# Patient Record
Sex: Female | Born: 1960 | Race: White | Hispanic: No | Marital: Married | Smoking: Former smoker
Health system: Southern US, Community
[De-identification: ages and names within clinical notes are randomized; demographics above are authoritative.]

## PROBLEM LIST (undated history)

## (undated) DIAGNOSIS — E785 Hyperlipidemia, unspecified: Secondary | ICD-10-CM

## (undated) DIAGNOSIS — A048 Other specified bacterial intestinal infections: Secondary | ICD-10-CM

## (undated) DIAGNOSIS — I509 Heart failure, unspecified: Secondary | ICD-10-CM

## (undated) DIAGNOSIS — M1990 Unspecified osteoarthritis, unspecified site: Secondary | ICD-10-CM

## (undated) DIAGNOSIS — J449 Chronic obstructive pulmonary disease, unspecified: Secondary | ICD-10-CM

## (undated) DIAGNOSIS — I10 Essential (primary) hypertension: Secondary | ICD-10-CM

## (undated) DIAGNOSIS — I2510 Atherosclerotic heart disease of native coronary artery without angina pectoris: Secondary | ICD-10-CM

## (undated) DIAGNOSIS — I219 Acute myocardial infarction, unspecified: Secondary | ICD-10-CM

## (undated) DIAGNOSIS — Z5189 Encounter for other specified aftercare: Secondary | ICD-10-CM

## (undated) HISTORY — PX: CHOLECYSTECTOMY: SHX55

## (undated) HISTORY — PX: CORONARY ARTERY BYPASS GRAFT: SHX141

---

## 2010-09-02 ENCOUNTER — Emergency Department (HOSPITAL_BASED_OUTPATIENT_CLINIC_OR_DEPARTMENT_OTHER)
Admission: EM | Admit: 2010-09-02 | Discharge: 2010-09-02 | Disposition: A | Discharge status: Home / Self Care | Payer: Medicare Other | Attending: Emergency Medicine | Admitting: Emergency Medicine

## 2010-09-02 ENCOUNTER — Encounter: Payer: Self-pay | Admitting: *Deleted

## 2010-09-02 DIAGNOSIS — R109 Unspecified abdominal pain: Secondary | ICD-10-CM

## 2010-09-02 DIAGNOSIS — I252 Old myocardial infarction: Secondary | ICD-10-CM | POA: Insufficient documentation

## 2010-09-02 DIAGNOSIS — Z79899 Other long term (current) drug therapy: Secondary | ICD-10-CM | POA: Insufficient documentation

## 2010-09-02 DIAGNOSIS — Z951 Presence of aortocoronary bypass graft: Secondary | ICD-10-CM | POA: Insufficient documentation

## 2010-09-02 DIAGNOSIS — J4489 Other specified chronic obstructive pulmonary disease: Secondary | ICD-10-CM | POA: Insufficient documentation

## 2010-09-02 DIAGNOSIS — F17200 Nicotine dependence, unspecified, uncomplicated: Secondary | ICD-10-CM | POA: Insufficient documentation

## 2010-09-02 DIAGNOSIS — J449 Chronic obstructive pulmonary disease, unspecified: Secondary | ICD-10-CM | POA: Insufficient documentation

## 2010-09-02 DIAGNOSIS — R197 Diarrhea, unspecified: Secondary | ICD-10-CM

## 2010-09-02 DIAGNOSIS — E785 Hyperlipidemia, unspecified: Secondary | ICD-10-CM | POA: Insufficient documentation

## 2010-09-02 HISTORY — DX: Hyperlipidemia, unspecified: E78.5

## 2010-09-02 HISTORY — DX: Chronic obstructive pulmonary disease, unspecified: J44.9

## 2010-09-02 HISTORY — DX: Acute myocardial infarction, unspecified: I21.9

## 2010-09-02 HISTORY — DX: Other specified bacterial intestinal infections: A04.8

## 2010-09-02 LAB — URINALYSIS, ROUTINE W REFLEX MICROSCOPIC
Ketones, ur: NEGATIVE mg/dL
Leukocytes, UA: NEGATIVE
Nitrite: NEGATIVE
Protein, ur: NEGATIVE mg/dL
Urobilinogen, UA: 0.2 mg/dL (ref 0.0–1.0)

## 2010-09-02 LAB — CBC
MCHC: 32.3 g/dL (ref 30.0–36.0)
Platelets: 210 10*3/uL (ref 150–400)
RDW: 14.3 % (ref 11.5–15.5)

## 2010-09-02 LAB — COMPREHENSIVE METABOLIC PANEL
ALT: 15 U/L (ref 0–35)
Albumin: 3.6 g/dL (ref 3.5–5.2)
Alkaline Phosphatase: 111 U/L (ref 39–117)
BUN: 11 mg/dL (ref 6–23)
Potassium: 3.9 mEq/L (ref 3.5–5.1)
Sodium: 138 mEq/L (ref 135–145)
Total Protein: 7.7 g/dL (ref 6.0–8.3)

## 2010-09-02 MED ORDER — PROMETHAZINE HCL 25 MG PO TABS
12.5000 mg | ORAL_TABLET | Freq: Once | ORAL | Status: AC
  Administered 2010-09-02: 12.5 mg via ORAL
  Filled 2010-09-02: qty 1

## 2010-09-02 MED ORDER — HYDROCODONE-ACETAMINOPHEN 5-325 MG PO TABS
2.0000 | ORAL_TABLET | Freq: Once | ORAL | Status: AC
  Administered 2010-09-02: 2 via ORAL
  Filled 2010-09-02: qty 2

## 2010-09-02 MED ORDER — HYDROCODONE-ACETAMINOPHEN 7.5-325 MG PO TABS: 1.0000 | ORAL_TABLET | ORAL | Status: AC | PRN

## 2010-09-02 NOTE — ED Provider Notes (Signed)
History     CSN: [id number] Arrival date & time: 09/02/2010  8:48 PM  Chief Complaint  Patient presents with  . Abdominal Pain   Patient is a 50 y.o. female presenting with abdominal pain. The history is provided by the patient.  Abdominal Pain The primary symptoms of the illness include abdominal pain, nausea and diarrhea. The primary symptoms of the illness do not include shortness of breath or dysuria. Episode onset: 3 weeks. The problem has not changed since onset. The diarrhea began more than 1 week ago. The diarrhea is semi-solid, malodorous and mucous. The diarrhea occurs 5 to 10 times per day.  The patient states that she believes she is currently not pregnant. The patient has not had a change in bowel habit. Additional symptoms associated with the illness include heartburn. Symptoms associated with the illness do not include chills, anorexia, diaphoresis, constipation, hematuria, frequency or back pain. Associated medical issues comments: question colitis..    Past Medical History  Diagnosis Date  . Acute MI   . Hyperlipidemia   . COPD (chronic obstructive pulmonary disease)   . Helicobacter pylori (H. pylori)     Past Surgical History  Procedure Date  . Coronary artery bypass graft   . Cholecystectomy     No family history on file.  History  Substance Use Topics  . Smoking status: Current Everyday Smoker  . Smokeless tobacco: Not on file  . Alcohol Use: Yes    OB History    Grav Para Term Preterm Abortions TAB SAB Ect Mult Living                  Review of Systems  Constitutional: Negative for chills, diaphoresis and activity change.       All ROS Neg except as noted in HPI  HENT: Negative for nosebleeds and neck pain.   Eyes: Negative for photophobia and discharge.  Respiratory: Negative for cough, shortness of breath and wheezing.   Cardiovascular: Negative for chest pain and palpitations.  Gastrointestinal: Positive for heartburn, nausea, abdominal pain  and diarrhea. Negative for constipation, blood in stool and anorexia.  Genitourinary: Negative for dysuria, frequency and hematuria.  Musculoskeletal: Negative for back pain and arthralgias.  Skin: Negative.   Neurological: Negative for dizziness, seizures and speech difficulty.  Psychiatric/Behavioral: Negative for hallucinations and confusion.    Physical Exam  BP 111/63  Pulse 88  Temp(Src) 98 F (36.7 C) (Oral)  Resp 20  SpO2 97%  Physical Exam  Nursing note and vitals reviewed. Constitutional: She is oriented to person, place, and time. She appears well-developed and well-nourished.  Non-toxic appearance.  HENT:  Head: Normocephalic.  Right Ear: Tympanic membrane and external ear normal.  Left Ear: Tympanic membrane and external ear normal.  Eyes: EOM and lids are normal. Pupils are equal, round, and reactive to light.  Neck: Normal range of motion. Neck supple. Carotid bruit is not present.  Cardiovascular: Normal rate, regular rhythm, normal heart sounds, intact distal pulses and normal pulses.   Pulmonary/Chest: Breath sounds normal. No respiratory distress.  Abdominal: Soft. Bowel sounds are normal. There is no tenderness. There is no guarding.       Increase bowel sounds present. No distention. No pain to palpation, but right and left lower quad pain present.  Genitourinary:       Chaperone present during rectal exam. Stool negative for occult blood.  Musculoskeletal: Normal range of motion.  Lymphadenopathy:       Head (right side): No submandibular  adenopathy present.       Head (left side): No submandibular adenopathy present.    She has no cervical adenopathy.  Neurological: She is alert and oriented to person, place, and time. She has normal strength. No cranial nerve deficit or sensory deficit.  Skin: Skin is warm and dry.  Psychiatric: She has a normal mood and affect. Her speech is normal.    ED Course  Procedures  MDM      Kathie Dike,  [place] 09/02/10 2221

## 2010-09-02 NOTE — ED Notes (Signed)
Pt c/o intermittent lower abd x3 weeks. Pt had appt to see her GI doctor today but the office called her and cancelled her appt.

## 2010-09-11 NOTE — ED Provider Notes (Signed)
Medical screening examination/treatment/procedure(s) were performed by non-physician practitioner and as supervising physician I was immediately available for consultation/collaboration.  Cyndra Numbers, MD 09/11/10 [phone]

## 2017-01-05 ENCOUNTER — Emergency Department (HOSPITAL_COMMUNITY): Payer: Medicare Other

## 2017-01-05 ENCOUNTER — Emergency Department (HOSPITAL_COMMUNITY)
Admission: EM | Admit: 2017-01-05 | Discharge: 2017-01-06 | Disposition: A | Discharge status: Home / Self Care | Payer: Medicare Other | Attending: Emergency Medicine | Admitting: Emergency Medicine

## 2017-01-05 ENCOUNTER — Encounter (HOSPITAL_COMMUNITY): Payer: Self-pay | Admitting: Emergency Medicine

## 2017-01-05 DIAGNOSIS — Z79899 Other long term (current) drug therapy: Secondary | ICD-10-CM | POA: Insufficient documentation

## 2017-01-05 DIAGNOSIS — Z87891 Personal history of nicotine dependence: Secondary | ICD-10-CM | POA: Insufficient documentation

## 2017-01-05 DIAGNOSIS — I2510 Atherosclerotic heart disease of native coronary artery without angina pectoris: Secondary | ICD-10-CM | POA: Diagnosis not present

## 2017-01-05 DIAGNOSIS — J441 Chronic obstructive pulmonary disease with (acute) exacerbation: Secondary | ICD-10-CM | POA: Insufficient documentation

## 2017-01-05 DIAGNOSIS — I509 Heart failure, unspecified: Secondary | ICD-10-CM | POA: Insufficient documentation

## 2017-01-05 DIAGNOSIS — Z7982 Long term (current) use of aspirin: Secondary | ICD-10-CM | POA: Insufficient documentation

## 2017-01-05 DIAGNOSIS — Z951 Presence of aortocoronary bypass graft: Secondary | ICD-10-CM | POA: Diagnosis not present

## 2017-01-05 DIAGNOSIS — R079 Chest pain, unspecified: Secondary | ICD-10-CM | POA: Diagnosis present

## 2017-01-05 DIAGNOSIS — I110 Hypertensive heart disease with heart failure: Secondary | ICD-10-CM | POA: Insufficient documentation

## 2017-01-05 HISTORY — DX: Unspecified osteoarthritis, unspecified site: M19.90

## 2017-01-05 HISTORY — DX: Encounter for other specified aftercare: Z51.89

## 2017-01-05 HISTORY — DX: Essential (primary) hypertension: I10

## 2017-01-05 HISTORY — DX: Atherosclerotic heart disease of native coronary artery without angina pectoris: I25.10

## 2017-01-05 HISTORY — DX: Heart failure, unspecified: I50.9

## 2017-01-05 LAB — I-STAT TROPONIN, ED: TROPONIN I, POC: 0.02 ng/mL (ref 0.00–0.08)

## 2017-01-05 LAB — CBC
HCT: 31.9 % — ABNORMAL LOW (ref 36.0–46.0)
Hemoglobin: 9.7 g/dL — ABNORMAL LOW (ref 12.0–15.0)
MCH: 31.2 pg (ref 26.0–34.0)
MCHC: 30.4 g/dL (ref 30.0–36.0)
MCV: 102.6 fL — AB (ref 78.0–100.0)
PLATELETS: 173 10*3/uL (ref 150–400)
RBC: 3.11 MIL/uL — ABNORMAL LOW (ref 3.87–5.11)
RDW: 14.8 % (ref 11.5–15.5)
WBC: 7.1 10*3/uL (ref 4.0–10.5)

## 2017-01-05 LAB — BASIC METABOLIC PANEL
Anion gap: 6 (ref 5–15)
BUN: 15 mg/dL (ref 6–20)
CHLORIDE: 91 mmol/L — AB (ref 101–111)
CO2: 43 mmol/L — AB (ref 22–32)
Calcium: 9 mg/dL (ref 8.9–10.3)
Creatinine, Ser: 1.1 mg/dL — ABNORMAL HIGH (ref 0.44–1.00)
GFR calc non Af Amer: 54 mL/min — ABNORMAL LOW (ref >60)
Glucose, Bld: 130 mg/dL — ABNORMAL HIGH (ref 65–99)
POTASSIUM: 3.8 mmol/L (ref 3.5–5.1)
SODIUM: 140 mmol/L (ref 135–145)

## 2017-01-05 LAB — I-STAT BETA HCG BLOOD, ED (MC, WL, AP ONLY): I-stat hCG, quantitative: <5 m[IU]/mL (ref <5)

## 2017-01-05 MED ORDER — IPRATROPIUM BROMIDE 0.02 % IN SOLN
0.5000 mg | Freq: Once | RESPIRATORY_TRACT | Status: AC
  Administered 2017-01-05: 0.5 mg via RESPIRATORY_TRACT
  Filled 2017-01-05: qty 2.5

## 2017-01-05 MED ORDER — ALBUTEROL SULFATE (2.5 MG/3ML) 0.083% IN NEBU
5.0000 mg | INHALATION_SOLUTION | Freq: Once | RESPIRATORY_TRACT | Status: AC
  Administered 2017-01-05: 5 mg via RESPIRATORY_TRACT
  Filled 2017-01-05: qty 6

## 2017-01-05 MED ORDER — AMOXICILLIN-POT CLAVULANATE 875-125 MG PO TABS: 1.0000 | ORAL_TABLET | Freq: Two times a day (BID) | ORAL | 0 refills | Status: AC

## 2017-01-05 MED ORDER — MORPHINE SULFATE (PF) 4 MG/ML IV SOLN
4.0000 mg | Freq: Once | INTRAVENOUS | Status: AC
  Administered 2017-01-05: 4 mg via INTRAVENOUS
  Filled 2017-01-05: qty 1

## 2017-01-05 MED ORDER — PREDNISONE 20 MG PO TABS: ORAL_TABLET | ORAL | 0 refills | Status: AC

## 2017-01-05 NOTE — Discharge Instructions (Signed)
Take medications as prescribed. You can return to [facility]. Follow up with your doctor for recheck in 2-3 days.

## 2017-01-05 NOTE — ED Provider Notes (Signed)
  Face-to-face evaluation   History: She presents for evaluation of chest pain and shortness of breath.  She is currently in a skilled nursing facility,  moved there yesterday [facility].  Patient is frustrated that she is not getting what she believes is appropriate care in the skilled section.  She has had respiratory difficulty for about a month, been hospitalized, went home, then rehospitalized and placed in a long-term treatment center for rehabilitation.  Physical exam: Obese alert cooperative.  No respiratory distress.  Lungs have decreased air movement, bilaterally, with scattered wheezing.  MDM-evaluation consistent with mild COPD exacerbation, without significant evidence for pneumonia.  Doubt ACS, PE or impending vascular collapse.  Medical screening examination/treatment/procedure(s) were conducted as a shared visit with non-physician practitioner(s) and myself.  I personally evaluated the patient during the encounter    Mancel BaleWentz, Tawnee Clegg, MD 01/12/17 [phone]

## 2017-01-05 NOTE — ED Notes (Signed)
ED Provider at bedside. 

## 2017-01-05 NOTE — ED Triage Notes (Signed)
[facility]. Pt at [facility] for recent resp failure; resp e/u. Pt in and out of Af[facility] RVR PTA; received 4 nitro and 324 ASA PTA. A&Ox4; family at bedside.

## 2017-01-05 NOTE — ED Provider Notes (Signed)
[facility] Provider Note   CSN: [id number] Arrival date & time: 01/05/17  2104     History   Chief Complaint Chief Complaint  Patient presents with  . Chest Pain    HPI Cynthia Zhang is a 56 y.o. female.  Patient with a history of CAD/MI, COPD, CHF, HTN, HLD, paroxysmal atrial fibrillation, presents with chest pain that started this afternoon while at rest. No nausea or vomiting. She describes the pain as a pressure that radiates into her right arm. She was given NTG x 5, 3 at [facility] where she is a resident, and 2 by EMS without relief or change of the pain. Currently it has decreased from a "6/10" to a "3/10". She reports that symptoms were not the same as previous MI. She reports SOB but that it is the same as SOB she has with COPD. Per record review she had acardiac catheterization 09/21/16 for NSTEMI without found for chest pain.   The history is provided by the patient and medical records. No language interpreter was used.  Chest Pain   Associated symptoms include shortness of breath. Pertinent negatives include no diaphoresis, no fever, no nausea and no vomiting.    Past Medical History:  Diagnosis Date  . Acute MI (HCC)   . Arthritis   . Blood transfusion without reported diagnosis   . CHF (congestive heart failure) (HCC)   . COPD (chronic obstructive pulmonary disease) (HCC)   . Coronary artery disease   . Helicobacter pylori (H. pylori)   . Hyperlipidemia   . Hypertension     There are no active problems to display for this patient.   Past Surgical History:  Procedure Laterality Date  . CHOLECYSTECTOMY    . CORONARY ARTERY BYPASS GRAFT      OB History    No data available       Home Medications    Prior to Admission medications   Medication Sig Start Date End Date Taking? Authorizing Provider  albuterol (PROVENTIL,VENTOLIN) 90 MCG/ACT inhaler Inhale 2 puffs into the lungs every 6 (six) hours as needed.  Shortness of breath and wheezing      [provider]  ALPRAZolam (XANAX) 1 MG tablet Take 1 mg by mouth 3 (three) times daily as needed. anxiety    [provider]  aspirin 325 MG EC tablet Take 325 mg by mouth daily.      [provider]  atorvastatin (LIPITOR) 40 MG tablet Take 40 mg by mouth daily.      [provider]  budesonide-formoterol (SYMBICORT) 160-4.5 MCG/ACT inhaler Inhale 2 puffs into the lungs 2 (two) times daily.      [provider]  Cyanocobalamin (VITAMIN B-12 IJ) Inject 1,000 mg as directed every 30 (thirty) days.      [provider]  DULoxetine (CYMBALTA) 60 MG capsule Take 60 mg by mouth 2 (two) times daily.      [provider]  ergocalciferol (VITAMIN D2) 50000 UNITS capsule Take 50,000 Units by mouth once a week. Take on Sunday     [provider]  ferrous sulfate 325 (65 FE) MG tablet Take 325 mg by mouth 2 (two) times daily.     [provider]  lisinopril (PRINIVIL,ZESTRIL) 20 MG tablet Take 20 mg by mouth daily.      [provider]  metoprolol (LOPRESSOR) 50 MG tablet Take 50 mg by mouth 2 (two) times daily.      [provider]  metoprolol (TOPROL-XL) 50 MG 24 hr tablet Take 50 mg by mouth daily.      [provider]  Multiple Vitamin (MULTIVITAMIN) tablet Take 1 tablet by mouth daily.      [provider]  pantoprazole (PROTONIX) 40 MG tablet Take 40 mg by mouth 2 (two) times daily.      [provider]  theophylline (THEO-24) 300 MG 24 hr capsule Take 300 mg by mouth 2 (two) times daily.      [provider]  tiotropium (SPIRIVA) 18 MCG inhalation capsule Place 18 mcg into inhaler and inhale daily.      [provider]    Family History No family history on file.  Social History Social History   Tobacco Use  . Smoking status: Former Smoker    Last attempt to quit: 05/06/2016    Years since quitting: 0.6    Substance Use Topics  . Alcohol use: No    Frequency: Never  . Drug use: No     Allergies   Patient has no known allergies.   Review of Systems Review of Systems  Constitutional: Negative for chills, diaphoresis and fever.  Respiratory: Positive for shortness of breath.   Cardiovascular: Positive for chest pain.  Gastrointestinal: Negative.  Negative for nausea and vomiting.  Musculoskeletal: Negative.   Skin: Negative.   Neurological: Negative.      Physical Exam Updated Vital Signs BP 111/69   Pulse 69   Resp 19   Ht 5\' 7"  (1.702 m)   Wt 86.2 kg (190 lb)   SpO2 98%   BMI 29.76 kg/m   Physical Exam  Constitutional: She is oriented to person, place, and time. She appears well-developed and well-nourished.  HENT:  Head: Normocephalic.  Neck: Normal range of motion. Neck supple.  Cardiovascular: An irregularly irregular rhythm present. Tachycardia present.  No murmur heard. Pulmonary/Chest: Effort normal. She has decreased breath sounds (Throughout). She has no wheezes. She has no rhonchi. She has no rales.  Abdominal: Soft. Bowel sounds are normal. There is no tenderness. There is no rebound and no guarding.  Musculoskeletal: Normal range of motion.       Right lower leg: Normal. She exhibits no edema.       Left lower leg: Normal. She exhibits no edema.  Neurological: She is alert and oriented to person, place, and time.  Skin: Skin is warm and dry. No rash noted.  Psychiatric: She has a normal mood and affect.     ED Treatments / Results  Labs (all labs ordered are listed, but only abnormal results are displayed) Labs Reviewed  BASIC METABOLIC PANEL  CBC  I-STAT TROPONIN, ED  I-STAT BETA HCG BLOOD, ED ([facility], [facility], [facility] ONLY)   Results for orders placed or performed during the hospital encounter of 01/05/17  CBC  Result Value Ref Range   WBC 7.1 4.0 - 10.5 K/uL   RBC 3.11 (L) 3.87 - 5.11 MIL/uL   Hemoglobin 9.7 (L) 12.0 - 15.0 g/dL   HCT 57.831.9 (L) 46.936.0 -  46.0 %   MCV 102.6 (H) 78.0 - 100.0 fL   MCH 31.2 26.0 - 34.0 pg   MCHC 30.4 30.0 - 36.0 g/dL   RDW 62.914.8 52.811.5 - 41.315.5 %   Platelets 173 150 - 400 K/uL  I-stat troponin, ED  Result Value Ref Range   Troponin i, poc 0.02 0.00 - 0.08 ng/mL   Comment 3  I-Stat beta hCG blood, ED  Result Value Ref Range   I-stat hCG, quantitative <5.0 <5 mIU/mL   Comment 3            EKG  EKG Interpretation None       Radiology No results found.  Procedures Procedures (including critical care time)  Medications Ordered in ED Medications  albuterol (PROVENTIL) (2.5 MG/3ML) 0.083% nebulizer solution 5 mg (5 mg Nebulization Given 01/05/17 2159)  ipratropium (ATROVENT) nebulizer solution 0.5 mg (0.5 mg Nebulization Given 01/05/17 2159)  morphine 4 MG/ML injection 4 mg (4 mg Intravenous Given 01/05/17 2200)     Initial Impression / Assessment and Plan / ED Course  I have reviewed the triage vital signs and the nursing notes.  Pertinent labs & imaging results that were available during my care of the patient were reviewed by me and considered in my medical decision making (see chart for details).     Patient presents with SOB and chest pain with history of COPD and CAD. Labs, EKG and imaging unremarkable to for concerning findings to suggest acute cardiac event or infection.   She is given multiple breathing treatments and seems to be improved. VSS, no hypoxia  She is currently residing at [facility] and was recently moved from one unit to another, which she is not happy with. She prefers to be hospitalized tonight but feel she is stable for discharge with presentation c/w COPD exacerbation. She is seen and evaluated by Dr. Effie Shy who has had a full discussion with patient and husband. She will be discharged back to [facility] to [facility] her rehab.  Final Clinical Impressions(s) / ED Diagnoses   Final diagnoses:  None   1. Mild COPD exacberation  ED Discharge Orders    None        Danne Harbor 01/11/17 2010    Mancel Bale, MD 01/12/17 [phone]

## 2017-01-05 NOTE — ED Notes (Signed)
Portable xray at bedside.

## 2018-01-23 DEATH — deceased

## 2018-04-15 IMAGING — DX DG CHEST 1V PORT
1 series · 1 of 1 positions shown · non-contrast
Comparison: Multiple prior exams most recent radiograph 12/25/2016.
Most recent CT 12/06/2016

CLINICAL DATA: Chest pain and shortness of breath tonight.

EXAM:
PORTABLE CHEST 1 VIEW

[chest]
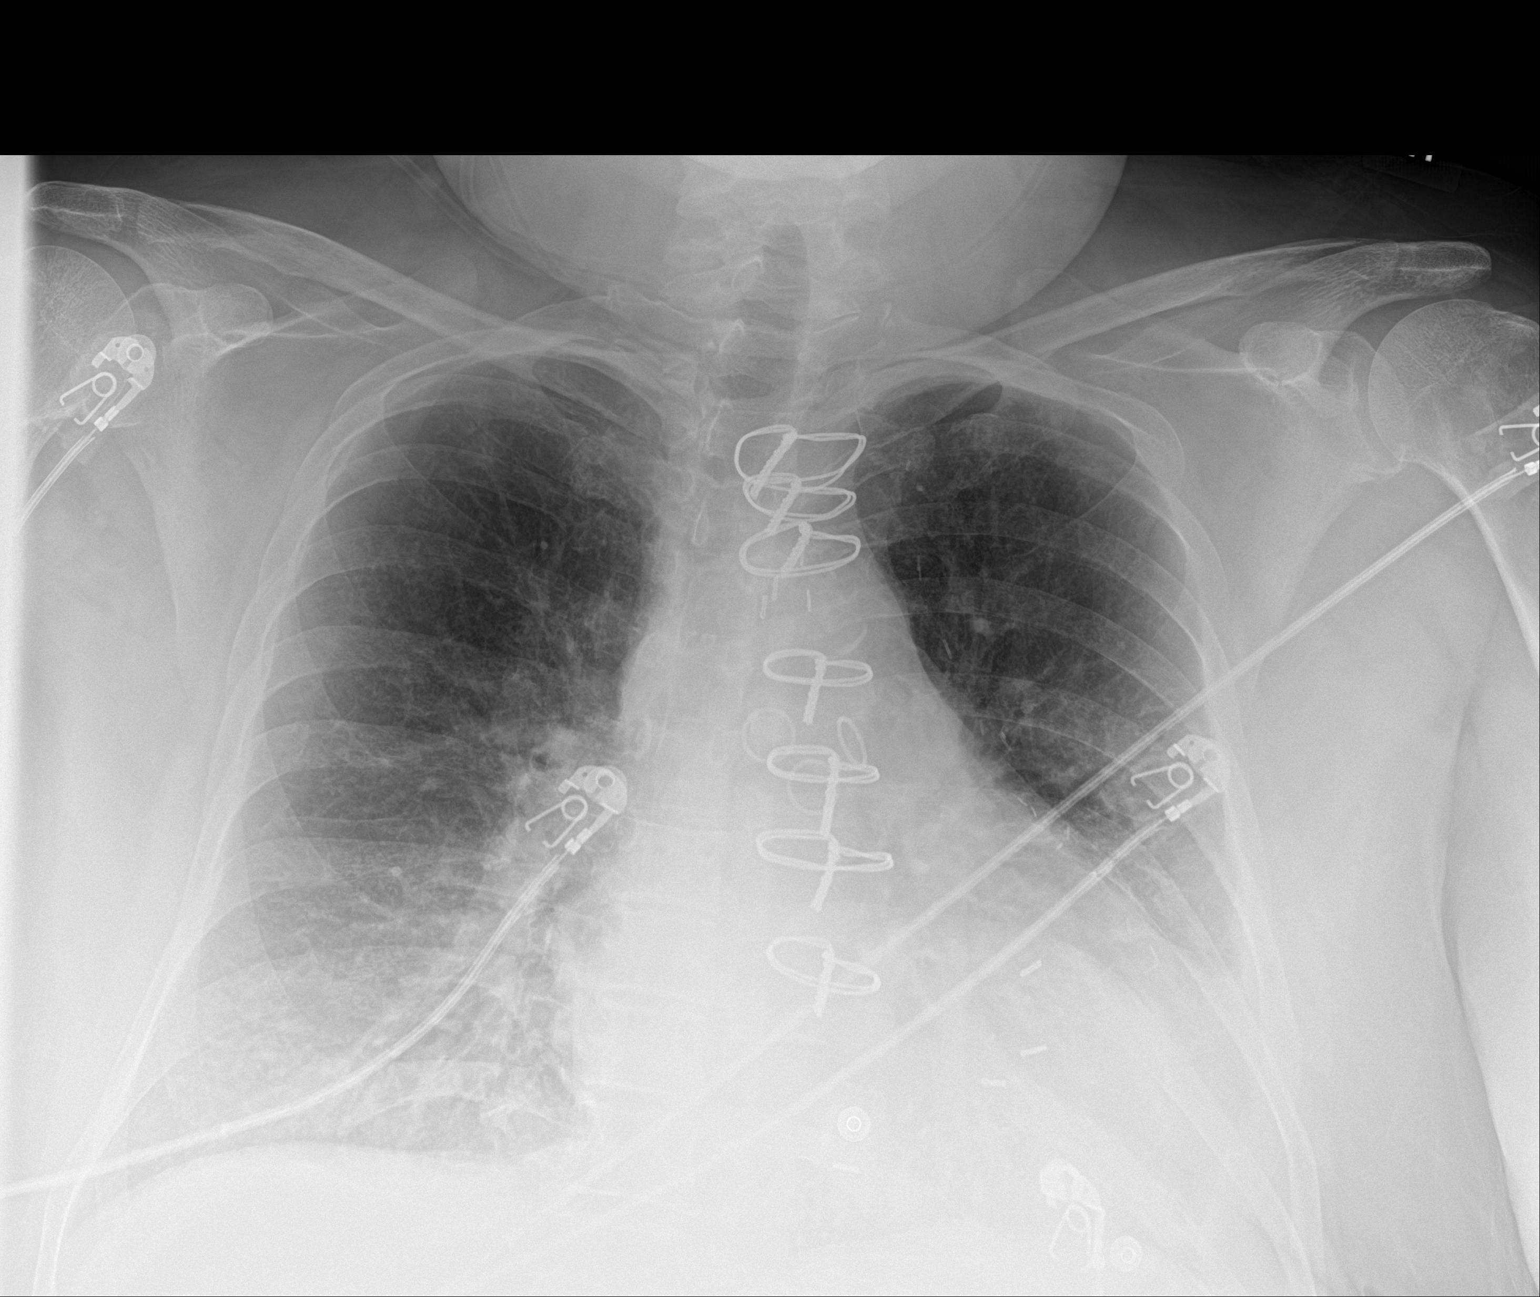

[1 of 1 positions shown; findings below may reference images not displayed]

FINDINGS: Post median sternotomy. Unchanged heart size and mediastinal
contours. Limited left basilar assessment due to soft tissue
attenuation. Vascular congestion without overt pulmonary edema. No
pneumothorax. Right lung is clear. No right pleural effusion.
IMPRESSION: Stable cardiomegaly. Limited left basilar assessment due to soft
tissue attenuation, difficult to exclude left basilar opacity or
pleural effusion. Recommend PA and lateral views when patient is
able.
